# Patient Record
Sex: Female | Born: 1980 | Race: Black or African American | Hispanic: No | Marital: Single | Smoking: Current every day smoker
Health system: Southern US, Community
[De-identification: ages and names within clinical notes are randomized; demographics above are authoritative.]

## PROBLEM LIST (undated history)

## (undated) DIAGNOSIS — F410 Panic disorder [episodic paroxysmal anxiety] without agoraphobia: Secondary | ICD-10-CM

## (undated) DIAGNOSIS — F32A Depression, unspecified: Secondary | ICD-10-CM

## (undated) DIAGNOSIS — F329 Major depressive disorder, single episode, unspecified: Secondary | ICD-10-CM

## (undated) HISTORY — PX: CYSTECTOMY: SUR359

## (undated) HISTORY — PX: ABDOMINAL HYSTERECTOMY: SHX81

---
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
## 1998-01-31 ENCOUNTER — Emergency Department (HOSPITAL_COMMUNITY): Admission: EM | Admit: 1998-01-31 | Discharge: 1998-01-31 | Payer: Self-pay | Admitting: Emergency Medicine

## 1999-11-22 ENCOUNTER — Inpatient Hospital Stay (HOSPITAL_COMMUNITY): Admission: AD | Admit: 1999-11-22 | Discharge: 1999-11-22 | Payer: Self-pay | Admitting: Obstetrics & Gynecology

## 1999-11-22 ENCOUNTER — Encounter: Payer: Self-pay | Admitting: Obstetrics & Gynecology

## 2000-07-19 ENCOUNTER — Encounter: Payer: Self-pay | Admitting: *Deleted

## 2000-07-19 ENCOUNTER — Emergency Department (HOSPITAL_COMMUNITY): Admission: EM | Admit: 2000-07-19 | Discharge: 2000-07-19 | Payer: Self-pay | Admitting: *Deleted

## 2002-03-05 ENCOUNTER — Emergency Department (HOSPITAL_COMMUNITY): Admission: EM | Admit: 2002-03-05 | Discharge: 2002-03-05 | Payer: Self-pay | Admitting: *Deleted

## 2002-07-18 ENCOUNTER — Emergency Department (HOSPITAL_COMMUNITY): Admission: EM | Admit: 2002-07-18 | Discharge: 2002-07-18 | Payer: Self-pay | Admitting: *Deleted

## 2011-08-11 ENCOUNTER — Emergency Department (HOSPITAL_COMMUNITY)
Admission: EM | Admit: 2011-08-11 | Discharge: 2011-08-11 | Disposition: A | Discharge status: Home / Self Care | Payer: Self-pay | Attending: Emergency Medicine | Admitting: Emergency Medicine

## 2011-08-11 ENCOUNTER — Encounter (HOSPITAL_COMMUNITY): Payer: Self-pay | Admitting: *Deleted

## 2011-08-11 DIAGNOSIS — R1110 Vomiting, unspecified: Secondary | ICD-10-CM | POA: Insufficient documentation

## 2011-08-11 DIAGNOSIS — R509 Fever, unspecified: Secondary | ICD-10-CM | POA: Insufficient documentation

## 2011-08-11 DIAGNOSIS — R070 Pain in throat: Secondary | ICD-10-CM | POA: Insufficient documentation

## 2011-08-11 DIAGNOSIS — R05 Cough: Secondary | ICD-10-CM | POA: Insufficient documentation

## 2011-08-11 DIAGNOSIS — F17200 Nicotine dependence, unspecified, uncomplicated: Secondary | ICD-10-CM | POA: Insufficient documentation

## 2011-08-11 DIAGNOSIS — H9209 Otalgia, unspecified ear: Secondary | ICD-10-CM | POA: Insufficient documentation

## 2011-08-11 DIAGNOSIS — H6690 Otitis media, unspecified, unspecified ear: Secondary | ICD-10-CM | POA: Insufficient documentation

## 2011-08-11 DIAGNOSIS — H6692 Otitis media, unspecified, left ear: Secondary | ICD-10-CM

## 2011-08-11 DIAGNOSIS — R059 Cough, unspecified: Secondary | ICD-10-CM | POA: Insufficient documentation

## 2011-08-11 MED ORDER — AZITHROMYCIN 250 MG PO TABS: ORAL_TABLET | ORAL | Status: DC

## 2011-08-11 NOTE — ED Notes (Signed)
Pt signed d/c papers. Social worker called to see if aid may be given to pt to help with Rx. Pt still in room.

## 2011-08-11 NOTE — Discharge Instructions (Signed)
Otitis Media, Adult  A middle ear infection is an infection in the space behind the eardrum. The medical name for this is "otitis media." It may happen after a common cold. It is caused by a germ that starts growing in that space. You may feel swollen glands in your neck on the side of the ear infection.  HOME CARE INSTRUCTIONS   · Take your medicine as directed until it is gone, even if you feel better after the first few days.  · Only take over-the-counter or prescription medicines for pain, discomfort, or fever as directed by your caregiver.  · Occasional use of a nasal decongestant a couple times per day may help with discomfort and help the eustachian tube to drain better.  Follow up with your caregiver in 10 to 14 days or as directed, to be certain that the infection has cleared. Not keeping the appointment could result in a chronic or permanent injury, pain, hearing loss and disability. If there is any problem keeping the appointment, you must call back to this facility for assistance.  SEEK IMMEDIATE MEDICAL CARE IF:   · You are not getting better in 2 to 3 days.  · You have pain that is not controlled with medication.  · You feel worse instead of better.  · You cannot use the medication as directed.  · You develop swelling, redness or pain around the ear or stiffness in your neck.  MAKE SURE YOU:   · Understand these instructions.  · Will watch your condition.  · Will get help right away if you are not doing well or get worse.  Document Released: 12/18/2003 Document Revised: 03/03/2011 Document Reviewed: 10/19/2007  ExitCare® Patient Information ©2012 [employer].

## 2011-08-11 NOTE — ED Notes (Signed)
Sore throat, headache, and left ear pain x 2 days.  Fever on day 1.

## 2011-08-11 NOTE — Care Management Note (Signed)
    Page 1 of 1   08/11/2011     9:34:28 AM   CARE MANAGEMENT NOTE 08/11/2011  Patient:  IREM, STONEHAM   Account Number:  [id number]  Date Initiated:  08/11/2011  Documentation initiated by:  Sharrie Rothman  Subjective/Objective Assessment:   Pt just moved to area 1 month ago and does not have a job. Pt has no family here. Pt requested help with medications. Pt stated that she has no one to help with medication assistance.     Action/Plan:   CM gave voucher to [facility] for z-pak $17.29. Explained process to pt to where to go to get script filled.   Anticipated DC Date:  08/11/2011   Anticipated DC Plan:  HOME/SELF CARE      DC Planning Services  Medication Assistance      Choice offered to / List presented to:             Status of service:  Completed, signed off Medicare Important Message given?   (If response is "NO", the following Medicare IM given date fields will be blank) Date Medicare IM given:   Date Additional Medicare IM given:    Discharge Disposition:  HOME/SELF CARE  Per UR Regulation:    If discussed at Long Length of Stay Meetings, dates discussed:    Comments:  08/11/11 0933 Arlyss Queen, RN BSN CM

## 2011-08-11 NOTE — ED Provider Notes (Signed)
History  This chart was scribed for Paige Cooper III, MD by Paige Wells. This patient was seen in room APA09/APA09 and the patient's care was started at 8:44AM.  CSN: [id number]  Arrival date & time 08/11/11  0820   First MD Initiated Contact with Patient 08/11/11 0825      Chief Complaint  Patient presents with  . Otalgia    (Consider location/radiation/quality/duration/timing/severity/associated sxs/prior treatment) Patient is a 31 y.o. female presenting with ear pain. The history is provided by the patient. No language interpreter was used.  Otalgia Associated symptoms include sore throat, vomiting (first day of episode) and cough. Pertinent negatives include no diarrhea and no rash.  Paige Wells is a 31 y.o. female who presents to the Emergency Department complaining of 2 days of gradual onset, gradually worsening otalgia. Pt reports that she has had a cold for the past 2 days with associated sore throat and fever. She states that her ear drum feels swollen and that there is fluids in her ear. She states that she could not sleep last night and only slept 2 hours the night before. She reports taking Theraflu, pain relievers and other cold medicines for her current symptoms with no relief. Pt denies difficulty urinating, syncope, diarrhea and rash but reports sore throat, vomiting, ear pain, cough and fever as associated symptoms. Pt has no h/o of any chronic medical conditions and is otherwise normally healthy. Pt is a current everyday smoker but denies a h/o alcohol use.   History reviewed. No pertinent past medical history.  Past Surgical History  Procedure Date  . Cystectomy   . Abdominal hysterectomy     partial    No family history on file.  History  Substance Use Topics  . Smoking status: Current Everyday Smoker    Types: Cigarettes  . Smokeless tobacco: Not on file  . Alcohol Use: No    OB History    Grav Para Term Preterm Abortions TAB SAB Ect Mult Living                   Review of Systems  Constitutional: Positive for fever (first day of episode).  HENT: Positive for ear pain and sore throat.   Respiratory: Positive for cough.   Gastrointestinal: Positive for vomiting (first day of episode). Negative for diarrhea.  Genitourinary: Negative for dysuria, hematuria and difficulty urinating.  Skin: Negative for rash.  Neurological: Negative for seizures and syncope.    Allergies  Penicillins  Home Medications  No current outpatient prescriptions on file.  Triage Vitals: BP 132/66  Pulse 112  Temp(Src) 98.2 F (36.8 C) (Oral)  Resp 16  Ht 5\' 6"  (1.676 m)  Wt 154 lb (69.854 kg)  BMI 24.86 kg/m2  SpO2 97%  Physical Exam  Nursing note and vitals reviewed. Constitutional: She is oriented to person, place, and time. She appears well-developed and well-nourished.  HENT:  Head: Atraumatic.  Right Ear: External ear normal.       Pt's left ear is infected, ear drum is red. Right ear normal.  Eyes: Conjunctivae and EOM are normal. Pupils are equal, round, and reactive to light.  Neck: No thyromegaly present.       Pt does not have swollen lymph nodes in her neck  Cardiovascular: Normal rate, regular rhythm and normal heart sounds.   Pulmonary/Chest: Effort normal and breath sounds normal.  Abdominal: Soft. Bowel sounds are normal.  Musculoskeletal: Normal range of motion. She exhibits no edema.  Neurological: She is  alert and oriented to person, place, and time.  Skin: Skin is warm and dry.  Psychiatric: She has a normal mood and affect. Her behavior is normal.    ED Course  Procedures (including critical care time) DIAGNOSTIC STUDIES: Oxygen Saturation is 97% on room air, adequate  by my interpretation.    COORDINATION OF CARE:  8:50AM: advised pt that she has an ear infection and advised her that she has to take the antibioticamoxicillin for 10 days to completely get rid of her infection. 9:04AM advised pt that because she  is allergic to penicillin that he would prescribed azithromycin that should be taken once a day     1. Left otitis media      I personally performed the services described in this documentation, which was scribed in my presence. The recorded information has been reviewed and considered.  Paige Wells, M.D.    Paige Cooper III, MD 08/12/11 2149

## 2012-12-24 ENCOUNTER — Emergency Department (HOSPITAL_COMMUNITY)
Admission: EM | Admit: 2012-12-24 | Discharge: 2012-12-25 | Disposition: A | Discharge status: Home / Self Care | Payer: Medicaid Other | Attending: Emergency Medicine | Admitting: Emergency Medicine

## 2012-12-24 DIAGNOSIS — R45851 Suicidal ideations: Secondary | ICD-10-CM

## 2012-12-24 DIAGNOSIS — F329 Major depressive disorder, single episode, unspecified: Secondary | ICD-10-CM | POA: Insufficient documentation

## 2012-12-24 DIAGNOSIS — R0789 Other chest pain: Secondary | ICD-10-CM | POA: Insufficient documentation

## 2012-12-24 DIAGNOSIS — F17200 Nicotine dependence, unspecified, uncomplicated: Secondary | ICD-10-CM | POA: Insufficient documentation

## 2012-12-24 DIAGNOSIS — T7431XA Adult psychological abuse, confirmed, initial encounter: Secondary | ICD-10-CM | POA: Insufficient documentation

## 2012-12-24 DIAGNOSIS — F3289 Other specified depressive episodes: Secondary | ICD-10-CM | POA: Insufficient documentation

## 2012-12-24 DIAGNOSIS — Z880 Allergy status to penicillin: Secondary | ICD-10-CM | POA: Insufficient documentation

## 2012-12-24 DIAGNOSIS — F411 Generalized anxiety disorder: Secondary | ICD-10-CM | POA: Insufficient documentation

## 2012-12-24 DIAGNOSIS — T7492XA Unspecified child maltreatment, confirmed, initial encounter: Secondary | ICD-10-CM | POA: Insufficient documentation

## 2012-12-24 DIAGNOSIS — IMO0002 Reserved for concepts with insufficient information to code with codable children: Secondary | ICD-10-CM | POA: Insufficient documentation

## 2012-12-24 DIAGNOSIS — T7491XA Unspecified adult maltreatment, confirmed, initial encounter: Secondary | ICD-10-CM | POA: Insufficient documentation

## 2012-12-24 DIAGNOSIS — R109 Unspecified abdominal pain: Secondary | ICD-10-CM | POA: Insufficient documentation

## 2012-12-24 DIAGNOSIS — R450 Nervousness: Secondary | ICD-10-CM | POA: Insufficient documentation

## 2012-12-24 HISTORY — DX: Panic disorder (episodic paroxysmal anxiety): F41.0

## 2012-12-24 MED ORDER — LORAZEPAM 1 MG PO TABS
1.0000 mg | ORAL_TABLET | Freq: Once | ORAL | Status: AC
  Administered 2012-12-25: 1 mg via ORAL
  Filled 2012-12-24: qty 1

## 2012-12-25 ENCOUNTER — Inpatient Hospital Stay (HOSPITAL_COMMUNITY): Admission: EM | Admit: 2012-12-25 | Payer: MEDICAID | Source: Intra-hospital | Admitting: Psychiatry

## 2012-12-25 ENCOUNTER — Telehealth (HOSPITAL_COMMUNITY): Payer: Self-pay | Admitting: Licensed Clinical Social Worker

## 2012-12-25 ENCOUNTER — Encounter (HOSPITAL_COMMUNITY): Payer: Self-pay

## 2012-12-25 LAB — URINALYSIS, ROUTINE W REFLEX MICROSCOPIC
Bilirubin Urine: NEGATIVE
Hgb urine dipstick: NEGATIVE
Nitrite: NEGATIVE
Protein, ur: NEGATIVE mg/dL
Specific Gravity, Urine: >1.03 — ABNORMAL HIGH (ref 1.005–1.030)
Urobilinogen, UA: 0.2 mg/dL (ref 0.0–1.0)
pH: 6 (ref 5.0–8.0)

## 2012-12-25 LAB — CBC WITH DIFFERENTIAL/PLATELET
Basophils Absolute: 0 10*3/uL (ref 0.0–0.1)
Basophils Relative: 1 % (ref 0–1)
Eosinophils Absolute: 0.1 10*3/uL (ref 0.0–0.7)
Eosinophils Relative: 2 % (ref 0–5)
HCT: 36.6 % (ref 36.0–46.0)
Hemoglobin: 12.4 g/dL (ref 12.0–15.0)
Lymphocytes Relative: 33 % (ref 12–46)
Lymphs Abs: 1.8 10*3/uL (ref 0.7–4.0)
MCH: 29.7 pg (ref 26.0–34.0)
MCHC: 33.9 g/dL (ref 30.0–36.0)
MCV: 87.6 fL (ref 78.0–100.0)
Monocytes Absolute: 0.6 10*3/uL (ref 0.1–1.0)
Monocytes Relative: 12 % (ref 3–12)
Neutro Abs: 2.9 10*3/uL (ref 1.7–7.7)
Neutrophils Relative %: 52 % (ref 43–77)
Platelets: 256 10*3/uL (ref 150–400)
RBC: 4.18 MIL/uL (ref 3.87–5.11)
RDW: 12.1 % (ref 11.5–15.5)
WBC: 5.4 10*3/uL (ref 4.0–10.5)

## 2012-12-25 LAB — COMPREHENSIVE METABOLIC PANEL
ALT: 8 U/L (ref 0–35)
AST: 18 U/L (ref 0–37)
Albumin: 3.7 g/dL (ref 3.5–5.2)
Alkaline Phosphatase: 33 U/L — ABNORMAL LOW (ref 39–117)
BUN: 6 mg/dL (ref 6–23)
CO2: 27 mEq/L (ref 19–32)
Calcium: 9.2 mg/dL (ref 8.4–10.5)
Chloride: 101 mEq/L (ref 96–112)
Creatinine, Ser: 0.77 mg/dL (ref 0.50–1.10)
GFR calc Af Amer: >90 mL/min (ref >90)
GFR calc non Af Amer: >90 mL/min (ref >90)
Glucose, Bld: 86 mg/dL (ref 70–99)
Potassium: 3.5 mEq/L (ref 3.5–5.1)
Sodium: 137 mEq/L (ref 135–145)
Total Bilirubin: 0.5 mg/dL (ref 0.3–1.2)
Total Protein: 7 g/dL (ref 6.0–8.3)

## 2012-12-25 LAB — RAPID URINE DRUG SCREEN, HOSP PERFORMED
Barbiturates: NOT DETECTED
Cocaine: NOT DETECTED
Opiates: NOT DETECTED

## 2012-12-25 LAB — ETHANOL: Alcohol, Ethyl (B): <11 mg/dL (ref 0–11)

## 2012-12-25 MED ORDER — IBUPROFEN 400 MG PO TABS
400.0000 mg | ORAL_TABLET | Freq: Once | ORAL | Status: AC
  Administered 2012-12-25: 400 mg via ORAL
  Filled 2012-12-25: qty 1
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
## 2012-12-25 NOTE — BH Assessment (Signed)
Telepsych completed will discuss disposition with ED physician.
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
## 2012-12-25 NOTE — ED Notes (Signed)
Referrals for pt received via fax from behavioral health.

## 2012-12-25 NOTE — ED Notes (Addendum)
After multiple calls by our staff, intake finally answered [phone]) and states there are several consults to be done ahead of this patient and they will call when they can.

## 2012-12-25 NOTE — ED Notes (Signed)
Received report on pt, pt resting with eyes closed, resp even and non labored, sitter remains at bedside,  
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
## 2012-12-25 NOTE — ED Notes (Signed)
See downtime progress note for additional documentation,
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
## 2012-12-25 NOTE — ED Provider Notes (Signed)
Telemedicine behavioral health consult done. Patient cleared to be discharged home no longer suicidal will contract for safety as per their note. Resource guide in discharge instructions provided.  Shelda Jakes, MD 12/25/12 [phone]

## 2012-12-25 NOTE — ED Notes (Signed)
Referral information given to pt, pt expressed understanding of calling if she needed assistance or returning to er. Cab voucher provided to pt.
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
## 2013-05-22 ENCOUNTER — Encounter (HOSPITAL_COMMUNITY): Payer: Self-pay | Admitting: Emergency Medicine

## 2013-05-22 ENCOUNTER — Emergency Department (HOSPITAL_COMMUNITY)
Admission: EM | Admit: 2013-05-22 | Discharge: 2013-05-22 | Disposition: A | Discharge status: Home / Self Care | Payer: Medicaid Other | Attending: Emergency Medicine | Admitting: Emergency Medicine

## 2013-05-22 DIAGNOSIS — Z043 Encounter for examination and observation following other accident: Secondary | ICD-10-CM | POA: Insufficient documentation

## 2013-05-22 DIAGNOSIS — F17200 Nicotine dependence, unspecified, uncomplicated: Secondary | ICD-10-CM | POA: Insufficient documentation

## 2013-05-22 DIAGNOSIS — IMO0002 Reserved for concepts with insufficient information to code with codable children: Secondary | ICD-10-CM

## 2013-05-22 DIAGNOSIS — Z8659 Personal history of other mental and behavioral disorders: Secondary | ICD-10-CM | POA: Insufficient documentation

## 2013-05-22 DIAGNOSIS — Z880 Allergy status to penicillin: Secondary | ICD-10-CM | POA: Insufficient documentation

## 2013-05-22 HISTORY — DX: Major depressive disorder, single episode, unspecified: F32.9

## 2013-05-22 HISTORY — DX: Depression, unspecified: F32.A
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
## 2013-10-11 ENCOUNTER — Encounter (HOSPITAL_COMMUNITY): Payer: Self-pay | Admitting: Emergency Medicine

## 2013-10-11 ENCOUNTER — Emergency Department (HOSPITAL_COMMUNITY)
Admission: EM | Admit: 2013-10-11 | Discharge: 2013-10-11 | Disposition: A | Discharge status: Home / Self Care | Payer: Medicaid Other | Attending: Emergency Medicine | Admitting: Emergency Medicine

## 2013-10-11 DIAGNOSIS — F17200 Nicotine dependence, unspecified, uncomplicated: Secondary | ICD-10-CM | POA: Diagnosis not present

## 2013-10-11 DIAGNOSIS — Z1389 Encounter for screening for other disorder: Secondary | ICD-10-CM | POA: Diagnosis present

## 2013-10-11 DIAGNOSIS — Z8659 Personal history of other mental and behavioral disorders: Secondary | ICD-10-CM | POA: Insufficient documentation

## 2013-10-11 DIAGNOSIS — Z880 Allergy status to penicillin: Secondary | ICD-10-CM | POA: Insufficient documentation

## 2013-10-11 DIAGNOSIS — Z0000 Encounter for general adult medical examination without abnormal findings: Secondary | ICD-10-CM

## 2013-10-11 DIAGNOSIS — Z008 Encounter for other general examination: Secondary | ICD-10-CM | POA: Insufficient documentation

## 2013-10-11 LAB — TROPONIN I

## 2013-10-11 NOTE — Discharge Instructions (Signed)

## 2013-10-11 NOTE — ED Notes (Signed)
Pt reports last Nov was donating plasma and started having chest pain.  Pt reports went back to same facility today to apply for job and was told had to have a doctor's note clearing her for work.  Pt denies any chest pain.  Reports feels fine.  Also reports tried to donate plasma again 2 days ago but was turned away because of the chest pain that she had while donating in Nov.

## 2013-10-11 NOTE — ED Notes (Signed)
Patient with no complaints at this time. Respirations even and unlabored. Skin warm/dry. Discharge instructions reviewed with patient at this time. Patient given opportunity to voice concerns/ask questions. Patient discharged at this time and left Emergency Department with steady gait.   

## 2013-10-11 NOTE — ED Provider Notes (Signed)
CSN: [id number]     Arrival date & time 10/11/13  1039 History  This chart was scribed for Glynn Octave, MD by Elon Spanner, ED Scribe. This patient was seen in room APA14/APA14 and the patient's care was started at 11:05 AM.    Chief Complaint  Patient presents with  . Medical screening     The history is provided by the patient. No language interpreter was used.    HPI Comments: Paige Wells is a 33 y.o. female who presents to the Emergency Department requesting medical clearance today.  Patient states she attempted to donate plasma 2 days ago but was told she could not do so until she received medical clearance relating to chest pain.  Patient states that in November 2014 she attempted to donate plasma but was not allowed to due to "chest pain".  She states, however, that it was never the case that she was experiencing "chest pain" in this instance.  Patient denies any current chest pain.  Patient notes that she was diagnosed with stress-related chest pain at 33 years old but reports no complications or current symptoms.  Patient denies changes in bowel/bladder function, cough, dyspnea, abdominal pain, chest pain, swelling/pain in legs, dizziness, lightheadness.  She states she is tolerating food and fluids well. She is not currently taking birth control.   [facility]  Past Medical History  Diagnosis Date  . Panic attacks   . Depression    Past Surgical History  Procedure Laterality Date  . Cystectomy    . Abdominal hysterectomy      partial   No family history on file. History  Substance Use Topics  . Smoking status: Current Every Day Smoker    Types: Cigarettes  . Smokeless tobacco: Not on file  . Alcohol Use: No   OB History   Grav Para Term Preterm Abortions TAB SAB Ect Mult Living                 Review of Systems  A complete 10 system review of systems was obtained and all systems are negative except as noted in the HPI and PMH.    Allergies   Penicillins  Home Medications   Prior to Admission medications   Medication Sig Start Date End Date Taking? Authorizing Provider  acetaminophen (TYLENOL) 500 MG tablet Take 1,000 mg by mouth every 6 (six) hours as needed.   Yes Historical Provider, MD   BP 113/75  Pulse 80  Temp(Src) 98.1 F (36.7 C) (Oral)  Resp 18  Ht 5\' 6"  (1.676 m)  Wt 170 lb (77.111 kg)  BMI 27.45 kg/m2  SpO2 100% Physical Exam  Nursing note and vitals reviewed. Constitutional: She is oriented to person, place, and time. She appears well-developed and well-nourished. No distress.  HENT:  Head: Normocephalic and atraumatic.  Mouth/Throat: Oropharynx is clear and moist. No oropharyngeal exudate.  Eyes: Conjunctivae and EOM are normal. Pupils are equal, round, and reactive to light.  Neck: Normal range of motion. Neck supple.  No meningismus.  Cardiovascular: Normal rate, regular rhythm, normal heart sounds and intact distal pulses.   No murmur heard. Pulmonary/Chest: Effort normal and breath sounds normal. No respiratory distress. She exhibits no tenderness.  No chest wall tenderness.  Abdominal: Soft. There is no tenderness. There is no rebound and no guarding.  Musculoskeletal: Normal range of motion. She exhibits no edema and no tenderness.  Neurological: She is alert and oriented to person, place, and time. No cranial nerve deficit.  She exhibits normal muscle tone. Coordination normal.  No ataxia on finger to nose bilaterally. No pronator drift. 5/5 strength throughout. CN 2-12 intact. Negative Romberg. Equal grip strength. Sensation intact. Gait is normal.   Skin: Skin is warm.  Psychiatric: She has a normal mood and affect. Her behavior is normal.    ED Course  Procedures (including critical care time)  DIAGNOSTIC STUDIES: Oxygen Saturation is 100% on RA, normal by my interpretation.    COORDINATION OF CARE:  11:08 AM Discussed plans to order labs.  Patient acknowledges and agrees with plan.    Labs Review Labs Reviewed  TROPONIN I    Imaging Review No results found.   EKG Interpretation   Date/Time:  Friday October 11 2013 11:11:57 EDT Ventricular Rate:  66 PR Interval:  173 QRS Duration: 82 QT Interval:  433 QTC Calculation: 454 R Axis:   83 Text Interpretation:  Sinus rhythm No previous ECGs available Confirmed by  Manus GunningANCOUR  MD, Calianne Larue ([id number]) on 10/11/2013 11:37:49 AM      MDM   Final diagnoses:  Well adult exam   Patient requesting medical clearance in order to donate plasma. She was told that she had chest pain when she donated in November but denies this herself. Denies any chest pain or shortness of breath currently.  Vital stable. No distress. No hypoxia or tachycardia. EKG normal sinus rhythm.  Patient is symptom-free at this time. She was sent here for clearance by the health department. No evidence of ACS or PE. Stable for outpatient followup.    I personally performed the services described in this documentation, which was scribed in my presence. The recorded information has been reviewed and is accurate.    Glynn OctaveStephen Torrey Ballinas, MD 10/11/13 1540

## 2013-11-01 ENCOUNTER — Emergency Department (HOSPITAL_COMMUNITY)
Admission: EM | Admit: 2013-11-01 | Discharge: 2013-11-01 | Disposition: A | Discharge status: Home / Self Care | Payer: Medicaid Other | Attending: Emergency Medicine | Admitting: Emergency Medicine

## 2013-11-01 ENCOUNTER — Encounter (HOSPITAL_COMMUNITY): Payer: Self-pay | Admitting: Emergency Medicine

## 2013-11-01 ENCOUNTER — Emergency Department (HOSPITAL_COMMUNITY): Payer: Medicaid Other

## 2013-11-01 DIAGNOSIS — Y9301 Activity, walking, marching and hiking: Secondary | ICD-10-CM | POA: Insufficient documentation

## 2013-11-01 DIAGNOSIS — Y929 Unspecified place or not applicable: Secondary | ICD-10-CM | POA: Diagnosis not present

## 2013-11-01 DIAGNOSIS — R296 Repeated falls: Secondary | ICD-10-CM | POA: Diagnosis not present

## 2013-11-01 DIAGNOSIS — Z880 Allergy status to penicillin: Secondary | ICD-10-CM | POA: Insufficient documentation

## 2013-11-01 DIAGNOSIS — S298XXA Other specified injuries of thorax, initial encounter: Secondary | ICD-10-CM | POA: Diagnosis not present

## 2013-11-01 DIAGNOSIS — F17200 Nicotine dependence, unspecified, uncomplicated: Secondary | ICD-10-CM | POA: Diagnosis not present

## 2013-11-01 DIAGNOSIS — R079 Chest pain, unspecified: Secondary | ICD-10-CM

## 2013-11-01 DIAGNOSIS — Z8659 Personal history of other mental and behavioral disorders: Secondary | ICD-10-CM | POA: Insufficient documentation

## 2013-11-01 DIAGNOSIS — M791 Myalgia, unspecified site: Secondary | ICD-10-CM

## 2013-11-01 DIAGNOSIS — R110 Nausea: Secondary | ICD-10-CM | POA: Insufficient documentation

## 2013-11-01 DIAGNOSIS — S3981XA Other specified injuries of abdomen, initial encounter: Secondary | ICD-10-CM | POA: Diagnosis not present

## 2013-11-01 DIAGNOSIS — G479 Sleep disorder, unspecified: Secondary | ICD-10-CM | POA: Diagnosis not present

## 2013-11-01 DIAGNOSIS — W19XXXA Unspecified fall, initial encounter: Secondary | ICD-10-CM

## 2013-11-01 LAB — URINALYSIS, ROUTINE W REFLEX MICROSCOPIC
BILIRUBIN URINE: NEGATIVE
Glucose, UA: NEGATIVE mg/dL
HGB URINE DIPSTICK: NEGATIVE
Leukocytes, UA: NEGATIVE
NITRITE: NEGATIVE
PROTEIN: NEGATIVE mg/dL
Specific Gravity, Urine: 1.025 (ref 1.005–1.030)
Urobilinogen, UA: 1 mg/dL (ref 0.0–1.0)
pH: 6 (ref 5.0–8.0)

## 2013-11-01 LAB — CBC WITH DIFFERENTIAL/PLATELET
BASOS ABS: 0 10*3/uL (ref 0.0–0.1)
BASOS PCT: 1 % (ref 0–1)
EOS ABS: 0.1 10*3/uL (ref 0.0–0.7)
Eosinophils Relative: 3 % (ref 0–5)
HCT: 38.1 % (ref 36.0–46.0)
HEMOGLOBIN: 13 g/dL (ref 12.0–15.0)
Lymphocytes Relative: 33 % (ref 12–46)
Lymphs Abs: 1.6 10*3/uL (ref 0.7–4.0)
MCH: 29.9 pg (ref 26.0–34.0)
MCHC: 34.1 g/dL (ref 30.0–36.0)
MCV: 87.6 fL (ref 78.0–100.0)
MONOS PCT: 17 % — AB (ref 3–12)
Monocytes Absolute: 0.8 10*3/uL (ref 0.1–1.0)
NEUTROS ABS: 2.2 10*3/uL (ref 1.7–7.7)
Neutrophils Relative %: 46 % (ref 43–77)
Platelets: 227 10*3/uL (ref 150–400)
RBC: 4.35 MIL/uL (ref 3.87–5.11)
RDW: 12.5 % (ref 11.5–15.5)
WBC: 4.7 10*3/uL (ref 4.0–10.5)

## 2013-11-01 LAB — BASIC METABOLIC PANEL
ANION GAP: 9 (ref 5–15)
BUN: 8 mg/dL (ref 6–23)
CHLORIDE: 105 meq/L (ref 96–112)
CO2: 25 mEq/L (ref 19–32)
Calcium: 8.6 mg/dL (ref 8.4–10.5)
Creatinine, Ser: 0.69 mg/dL (ref 0.50–1.10)
GFR calc Af Amer: >90 mL/min (ref >90)
Glucose, Bld: 97 mg/dL (ref 70–99)
POTASSIUM: 3.7 meq/L (ref 3.7–5.3)
Sodium: 139 mEq/L (ref 137–147)

## 2013-11-01 LAB — CK: Total CK: 74 U/L (ref 7–177)

## 2013-11-01 LAB — CBG MONITORING, ED: GLUCOSE-CAPILLARY: 90 mg/dL (ref 70–99)

## 2013-11-01 MED ORDER — SODIUM CHLORIDE 0.9 % IV BOLUS (SEPSIS)
1000.0000 mL | Freq: Once | INTRAVENOUS | Status: AC
  Administered 2013-11-01: 1000 mL via INTRAVENOUS

## 2013-11-01 NOTE — Discharge Instructions (Signed)

## 2013-11-01 NOTE — ED Notes (Signed)
Pt states she fell while walking this morning, felt very tired before the fall, pt states she fell on rt side, unsure if she hit her head or had LOC, pt's family states she was "out of it" when he came home.

## 2013-11-01 NOTE — ED Notes (Signed)
Discharge instructions reviewed with pt, questions answered. Pt verbalized understanding.  

## 2013-11-01 NOTE — ED Provider Notes (Signed)
CSN: [id number]     Arrival date & time 11/01/13  1017 History  This chart was scribed for Paige Gaskins, MD by Jarvis Morgan, ED Scribe. This patient was seen in room [facility]11/[facility]11 and the patient's care was started at 10:53 AM.    Chief Complaint  Patient presents with  . Fall     Patient is a 33 y.o. female presenting with weakness. The history is provided by the patient and a relative. No language interpreter was used.  Weakness This is a new problem. The current episode started more than 2 days ago. The problem occurs constantly. Associated symptoms include chest pain (centralized) and abdominal pain. Pertinent negatives include no headaches and no shortness of breath. The symptoms are aggravated by walking and exertion. Nothing relieves the symptoms. She has tried nothing for the symptoms.   HPI Comments: Paige Wells is a 34 y.o. female who presents to the Emergency Department due to a fall that occurred yesterday. Patient states that leading up to the fall she had been feeling constant, increased generalized fatigue for several days. Pt unsure of exactly what triggered the fall. She believes she fell on her right side because she states when she woke up she was having right sided pain that radiates from right arm down into right leg.  Pt is also having associated episodic centralized chest pain yesterday, abdominal pain and nausea. She states that the pain in arms and legs and chest pain is exacerbated by movement. Denies any chest pain with deep inspiration.  No CP at this time.  Patient is unsure if she hit her head or had LOC. She denies taking any daily medications. Pt relative states that the pt was "out of it when he came home. He states she was not unconscious but had some slurred speech. She states she has not been sleeping well. She denies any HA, SOB, diaphoresis, fever, emesis, or hematochezia.     Past Medical History  Diagnosis Date  . Panic attacks   . Depression     Past Surgical History  Procedure Laterality Date  . Cystectomy    . Abdominal hysterectomy      partial   History reviewed. No pertinent family history. History  Substance Use Topics  . Smoking status: Current Every Day Smoker    Types: Cigarettes  . Smokeless tobacco: Not on file  . Alcohol Use: No   OB History   Grav Para Term Preterm Abortions TAB SAB Ect Mult Living                 Review of Systems  Constitutional: Positive for fatigue (increased). Negative for fever, chills and diaphoresis.  HENT: Positive for voice change (per relative pt had slurred speech, now resolved).   Respiratory: Negative for shortness of breath.   Cardiovascular: Positive for chest pain (centralized).  Gastrointestinal: Positive for nausea and abdominal pain. Negative for vomiting, diarrhea and blood in stool.  Musculoskeletal: Positive for arthralgias (pain radiates from right arm down to right leg) and myalgias (pain radiates from right arm down to right leg).  Neurological: Positive for weakness (generalized). Negative for syncope and headaches.  Psychiatric/Behavioral: Positive for sleep disturbance (per relative has not been sleeping normally).  All other systems reviewed and are negative.     Allergies  Penicillins  Home Medications   Prior to Admission medications   Medication Sig Start Date End Date Taking? Authorizing Provider  acetaminophen (TYLENOL) 500 MG tablet Take 1,000 mg by mouth every  6 (six) hours as needed.   Yes Historical Provider, MD  naproxen sodium (ANAPROX) 220 MG tablet Take 440 mg by mouth daily as needed (headache).   Yes Historical Provider, MD   Triage Vitals: BP 118/77  Pulse 70  Temp(Src) 98.6 F (37 C) (Oral)  Resp 16  Ht 5\' 7"  (1.702 m)  Wt 170 lb (77.111 kg)  BMI 26.62 kg/m2  SpO2 99%  Physical Exam  Nursing note and vitals reviewed.  CONSTITUTIONAL: Well developed/well nourished HEAD: Normocephalic/atraumatic. No signs of trauma EYES:  EOMI/PERRL ENMT: Mucous membranes moist NECK: supple no meningeal signs SPINE:entire spine nontender CV: S1/S2 noted, no murmurs/rubs/gallops noted LUNGS: Lungs are clear to auscultation bilaterally, no apparent distress ABDOMEN: soft, nontender, no rebound or guarding GU:no cva tenderness NEURO: Pt is awake/alert, moves all extremitiesx4. Pt conversant. No arm or leg drift, no facial droop. EXTREMITIES: pulses normal, full ROM. No signs of trauma, no deformity, or edema noted. SKIN: warm, color normal PSYCH: flat affect  .  ED Course  Procedures  DIAGNOSTIC STUDIES: Oxygen Saturation is 99% on RA, normal by my interpretation.    COORDINATION OF CARE: 11:00 AM- Will order NaCl bolus, CXR, CK, CBC with diff, BMP, UA, POC CBG, EKG and orthostatic vital signs. 11:10 AM- Pt advised of plan for treatment and pt agrees.   1:29 PM Pt resting comfortably Feels improved Ambulatory Labs reassuring Pt with multiple vague complaints (chest pain yesterday, fatigue for several days, muscle pain) Without any family present, I asked patient if she felt safe at home -she reports that she does feel safe She denies SI/HI.   She reports increasing fatigue.  Will need outpatient f/u and workup  Labs Review Labs Reviewed  CBC WITH DIFFERENTIAL - Abnormal; Notable for the following:    Monocytes Relative 17 (*)    All other components within normal limits  URINALYSIS, ROUTINE W REFLEX MICROSCOPIC - Abnormal; Notable for the following:    Ketones, ur TRACE (*)    All other components within normal limits  BASIC METABOLIC PANEL  CK  CBG MONITORING, ED    Imaging Review Dg Chest Portable 1 View  11/01/2013   CLINICAL DATA:  Chest pain.  EXAM: PORTABLE CHEST - 1 VIEW  COMPARISON:  None.  FINDINGS: The heart size and mediastinal contours are within normal limits. There is no evidence of pulmonary edema, consolidation, pneumothorax, nodule or pleural fluid. The visualized skeletal structures are  unremarkable.  IMPRESSION: No active disease.   Electronically Signed   By: Irish LackGlenn  Yamagata M.D.   On: 11/01/2013 11:31     EKG Interpretation   Date/Time:  Friday November 01 2013 11:29:47 EDT Ventricular Rate:  65 PR Interval:  146 QRS Duration: 83 QT Interval:  433 QTC Calculation: 450 R Axis:   98 Text Interpretation:  Sinus rhythm Borderline right axis deviation No  significant change since last tracing Confirmed by Bebe ShaggyWICKLINE  MD, Dorinda HillNALD  [phone]([id number]) on 11/01/2013 11:40:52 AM      MDM   Final diagnoses:  Fall, initial encounter  Myalgia  Chest pain, unspecified chest pain type    Nursing notes including past medical history and social history reviewed and considered in documentation Labs/vital reviewed and considered xrays reviewed and considered Previous records reviewed and considered   I personally performed the services described in this documentation, which was scribed in my presence. The recorded information has been reviewed and is accurate.       Paige Gaskinsonald W Maeva Dant, MD 11/01/13 1332

## 2015-09-04 IMAGING — CR DG CHEST 1V PORT
1 series · 1 of 1 positions shown · non-contrast
Comparison: None.

CLINICAL DATA: Chest pain.

EXAM:
PORTABLE CHEST - 1 VIEW

[portable]
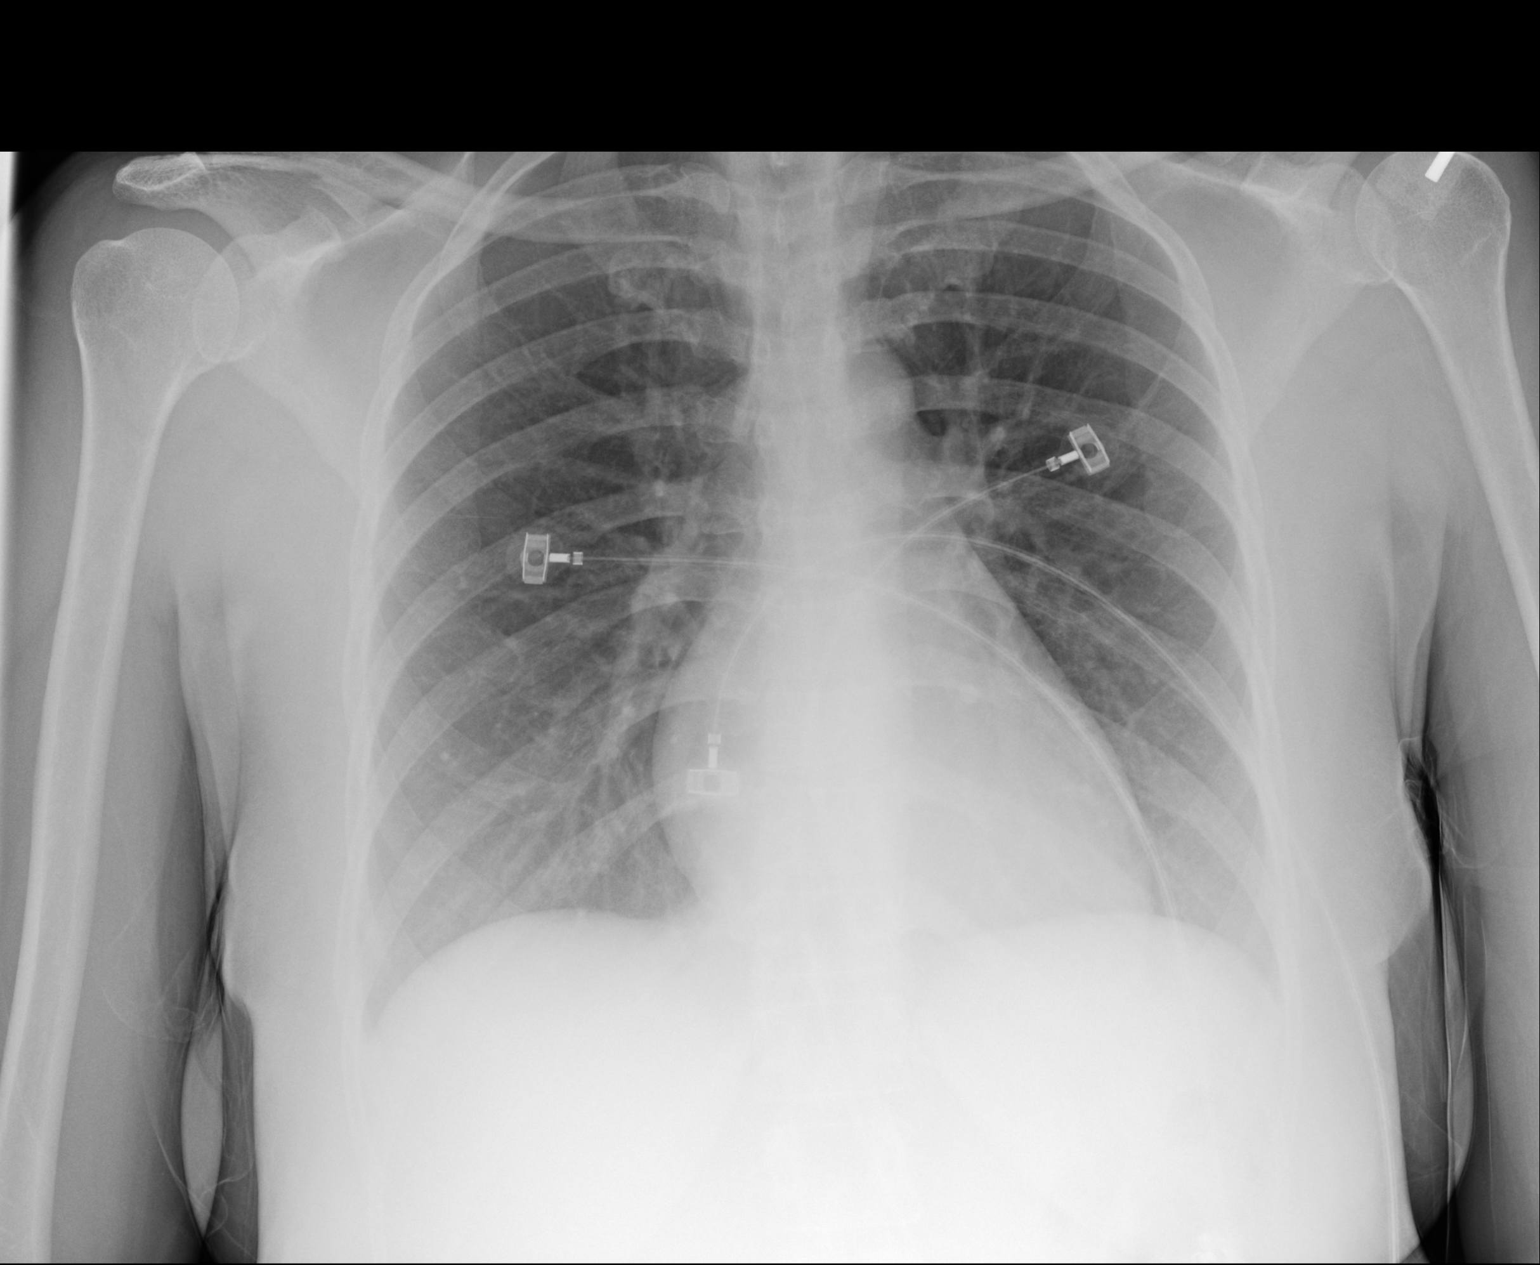

[1 of 1 positions shown; findings below may reference images not displayed]

FINDINGS: The heart size and mediastinal contours are within normal limits.
There is no evidence of pulmonary edema, consolidation,
pneumothorax, nodule or pleural fluid. The visualized skeletal
structures are unremarkable.
IMPRESSION: No active disease.
